# Patient Record
Sex: Male | Born: 1975 | Race: Black or African American | Hispanic: No | Marital: Married | Smoking: Current every day smoker
Health system: Southern US, Community
[De-identification: ages and names within clinical notes are randomized; demographics above are authoritative.]

## PROBLEM LIST (undated history)

## (undated) DIAGNOSIS — J45909 Unspecified asthma, uncomplicated: Secondary | ICD-10-CM

## (undated) HISTORY — PX: LEG SURGERY: SHX1003

---

## 2012-11-23 ENCOUNTER — Emergency Department (HOSPITAL_COMMUNITY)
Admission: EM | Admit: 2012-11-23 | Discharge: 2012-11-23 | Disposition: A | Discharge status: Home / Self Care | Payer: BC Managed Care – PPO | Attending: Emergency Medicine | Admitting: Emergency Medicine

## 2012-11-23 ENCOUNTER — Encounter (HOSPITAL_COMMUNITY): Payer: Self-pay | Admitting: Emergency Medicine

## 2012-11-23 DIAGNOSIS — K0889 Other specified disorders of teeth and supporting structures: Secondary | ICD-10-CM

## 2012-11-23 DIAGNOSIS — K089 Disorder of teeth and supporting structures, unspecified: Secondary | ICD-10-CM | POA: Insufficient documentation

## 2012-11-23 DIAGNOSIS — R51 Headache: Secondary | ICD-10-CM | POA: Insufficient documentation

## 2012-11-23 DIAGNOSIS — F17200 Nicotine dependence, unspecified, uncomplicated: Secondary | ICD-10-CM | POA: Insufficient documentation

## 2012-11-23 DIAGNOSIS — J45909 Unspecified asthma, uncomplicated: Secondary | ICD-10-CM | POA: Insufficient documentation

## 2012-11-23 HISTORY — DX: Unspecified asthma, uncomplicated: J45.909

## 2012-11-23 MED ORDER — HYDROCODONE-ACETAMINOPHEN 5-325 MG PO TABS
2.0000 | ORAL_TABLET | Freq: Once | ORAL | Status: AC
  Administered 2012-11-23: 2 via ORAL
  Filled 2012-11-23: qty 2

## 2012-11-23 MED ORDER — PENICILLIN V POTASSIUM 500 MG PO TABS: 500.0000 mg | ORAL_TABLET | Freq: Four times a day (QID) | ORAL | Status: AC

## 2012-11-23 MED ORDER — HYDROCODONE-ACETAMINOPHEN 5-325 MG PO TABS: 1.0000 | ORAL_TABLET | ORAL | Status: DC | PRN

## 2012-11-23 MED ORDER — IBUPROFEN 200 MG PO TABS
600.0000 mg | ORAL_TABLET | Freq: Once | ORAL | Status: AC
  Administered 2012-11-23: 600 mg via ORAL
  Filled 2012-11-23: qty 3

## 2012-11-23 NOTE — ED Provider Notes (Signed)
   History    CSN: [id number] Arrival date & time 11/23/12  5784  First MD Initiated Contact with Patient 11/23/12 0945     Chief Complaint  Patient presents with  . Dental Pain   (Consider location/radiation/quality/duration/timing/severity/associated sxs/prior Treatment) HPI Comments: 37 yo with smoking and dental issues in the past presents with right upper dental pain for a few days.  Pt has a dentist to fup with this week.  Tried motrin early this am, no improvement.   Patient is a 37 y.o. male presenting with tooth pain. The history is provided by the patient.  Dental Pain Location:  Upper Upper teeth location:  3/RU 1st molar Quality:  Aching Onset quality:  Sudden Timing:  Constant Associated symptoms: headaches   Associated symptoms: no fever and no neck pain    Past Medical History  Diagnosis Date  . Asthma    History reviewed. No pertinent past surgical history. No family history on file. History  Substance Use Topics  . Smoking status: Current Every Day Smoker  . Smokeless tobacco: Not on file  . Alcohol Use: Yes    Review of Systems  Constitutional: Negative for fever and chills.  HENT: Positive for dental problem. Negative for neck pain.   Eyes: Negative for visual disturbance.  Gastrointestinal: Negative for abdominal pain.  Neurological: Positive for headaches.    Allergies  Review of patient's allergies indicates not on file.  Home Medications   Current Outpatient Rx  Name  Route  Sig  Dispense  Refill  . HYDROcodone-acetaminophen (NORCO/VICODIN) 5-325 MG per tablet   Oral   Take 1-2 tablets by mouth every 4 (four) hours as needed for pain.   8 tablet   0   . penicillin v potassium (VEETID) 500 MG tablet   Oral   Take 1 tablet (500 mg total) by mouth 4 (four) times daily.   40 tablet   0    BP 130/88  Pulse 56  Temp(Src) 98.2 F (36.8 C) (Oral)  Resp 16  SpO2 99% Physical Exam  Nursing note and vitals reviewed. Constitutional:  He appears well-developed and well-nourished. No distress.  HENT:  Head: Normocephalic.  Poor dentition, multiple fractured teeth Tender gingiva/ right upper first molar, no abscess appreciated, facial swelling, submandibular swelling or trismus  Eyes: Conjunctivae are normal. Pupils are equal, round, and reactive to light.  Neck: Normal range of motion. Neck supple. No JVD present.  Cardiovascular: Normal rate.   Lymphadenopathy:    He has no cervical adenopathy.    ED Course  Procedures (including critical care time) Labs Reviewed - No data to display No results found. 1. Pain, dental     MDM  Well appearing. Dental fup discused abx and pain meds dc  Enid Skeens, MD 11/23/12 1009

## 2012-11-23 NOTE — ED Notes (Signed)
Pt. Stated, i started having dental pain with jaw and rt. Side ear pain.

## 2012-12-17 ENCOUNTER — Encounter (HOSPITAL_COMMUNITY): Payer: Self-pay | Admitting: *Deleted

## 2012-12-17 ENCOUNTER — Emergency Department (HOSPITAL_COMMUNITY)
Admission: EM | Admit: 2012-12-17 | Discharge: 2012-12-17 | Disposition: A | Discharge status: Home Health | Payer: BC Managed Care – PPO | Attending: Emergency Medicine | Admitting: Emergency Medicine

## 2012-12-17 DIAGNOSIS — K089 Disorder of teeth and supporting structures, unspecified: Secondary | ICD-10-CM | POA: Insufficient documentation

## 2012-12-17 DIAGNOSIS — K0889 Other specified disorders of teeth and supporting structures: Secondary | ICD-10-CM

## 2012-12-17 DIAGNOSIS — J45909 Unspecified asthma, uncomplicated: Secondary | ICD-10-CM | POA: Insufficient documentation

## 2012-12-17 DIAGNOSIS — F17200 Nicotine dependence, unspecified, uncomplicated: Secondary | ICD-10-CM | POA: Insufficient documentation

## 2012-12-17 MED ORDER — AMOXICILLIN 500 MG PO CAPS: 500.0000 mg | ORAL_CAPSULE | Freq: Three times a day (TID) | ORAL | Status: DC

## 2012-12-17 MED ORDER — TRAMADOL HCL 50 MG PO TABS: 50.0000 mg | ORAL_TABLET | Freq: Four times a day (QID) | ORAL | Status: DC | PRN

## 2012-12-17 NOTE — ED Provider Notes (Signed)
Medical screening examination/treatment/procedure(s) were performed by non-physician practitioner and as supervising physician I was immediately available for consultation/collaboration.  Ratliff Skene, M.D.     Robinson Skene, MD 12/17/12 [phone]

## 2012-12-17 NOTE — ED Notes (Signed)
Pt complains of dental pain in upper Rt molar x2 weeks. Pt denies having a dentist.

## 2012-12-17 NOTE — ED Provider Notes (Signed)
  CSN: [id number]     Arrival date & time 12/17/12  0033 History     First MD Initiated Contact with Patient 12/17/12 0048     Chief Complaint  Patient presents with  . Dental Pain   (Consider location/radiation/quality/duration/timing/severity/associated sxs/prior Treatment) HPI  37 year old male presents complaining of dental pain. Symptom has been ongoing for the past 2-3 weeks. Pain is described as a sharp and throbbing sensations which started at his right upper tooth and now radiates around his entire mouth. Pain is worsening with cold air or with chewing. Pain is the same pain that he had when he was seen to 3 weeks ago for the same complaint and was prescribed penicillin and Vicodin. States initially the medication has helped a few days but now the pain has returned. He has been taking ibuprofen with some relief but now ibuprofen is not helping. He has not had a chance to followup with a dentist. However, he requests for dental referral. Patient denies fever, headache, ear pain, throat swelling, neck pain, rash, or recent trauma. He is a smoker. Patient states in the past amoxicillin seems to help with his prior dental infection.  Past Medical History  Diagnosis Date  . Asthma    History reviewed. No pertinent past surgical history. History reviewed. No pertinent family history. History  Substance Use Topics  . Smoking status: Current Every Day Smoker -- 0.50 packs/day    Types: Cigarettes  . Smokeless tobacco: Not on file  . Alcohol Use: Yes     Comment: occasional    Review of Systems  Constitutional: Negative for fever.  HENT: Positive for dental problem. Negative for ear pain.   Skin: Negative for rash.  Neurological: Negative for headaches.    Allergies  Dilantin  Home Medications   Current Outpatient Rx  Name  Route  Sig  Dispense  Refill  . ibuprofen (ADVIL,MOTRIN) 200 MG tablet   Oral   Take 600 mg by mouth every 6 (six) hours as needed for pain.          BP 140/92  Pulse 64  Temp(Src) 98.5 F (36.9 C) (Oral)  Resp 18  SpO2 98% Physical Exam  Nursing note and vitals reviewed. Constitutional: He appears well-developed and well-nourished. No distress.  HENT:  Head: Atraumatic.  Right Ear: External ear normal.  Left Ear: External ear normal.  Mouth/Throat: Oropharynx is clear and moist.    Eyes: Conjunctivae are normal.  Neck: Neck supple.  Lymphadenopathy:    He has no cervical adenopathy.  Neurological: He is alert.  Skin: No rash noted.    ED Course   Procedures (including critical care time)  Patient with dental pain. No obvious abscess. No trismus. He request for a stronger medication than ibuprofen.  Will prescribe Ultram, and amox.  Dental referral given.  Pt agrees with plan.    Labs Reviewed - No data to display No results found. 1. Pain, dental     MDM  BP 140/92  Pulse 64  Temp(Src) 98.5 F (36.9 C) (Oral)  Resp 18  SpO2 98%   Fayrene Helper, PA-C 12/17/12 0140

## 2013-07-25 ENCOUNTER — Emergency Department (HOSPITAL_COMMUNITY): Payer: No Typology Code available for payment source

## 2013-07-25 ENCOUNTER — Emergency Department (HOSPITAL_COMMUNITY)
Admission: EM | Admit: 2013-07-25 | Discharge: 2013-07-25 | Disposition: A | Discharge status: Home / Self Care | Payer: No Typology Code available for payment source | Attending: Emergency Medicine | Admitting: Emergency Medicine

## 2013-07-25 ENCOUNTER — Encounter (HOSPITAL_COMMUNITY): Payer: Self-pay | Admitting: Emergency Medicine

## 2013-07-25 DIAGNOSIS — S46909A Unspecified injury of unspecified muscle, fascia and tendon at shoulder and upper arm level, unspecified arm, initial encounter: Secondary | ICD-10-CM | POA: Insufficient documentation

## 2013-07-25 DIAGNOSIS — Z79899 Other long term (current) drug therapy: Secondary | ICD-10-CM | POA: Insufficient documentation

## 2013-07-25 DIAGNOSIS — S199XXA Unspecified injury of neck, initial encounter: Secondary | ICD-10-CM

## 2013-07-25 DIAGNOSIS — S0993XA Unspecified injury of face, initial encounter: Secondary | ICD-10-CM | POA: Insufficient documentation

## 2013-07-25 DIAGNOSIS — F17200 Nicotine dependence, unspecified, uncomplicated: Secondary | ICD-10-CM | POA: Insufficient documentation

## 2013-07-25 DIAGNOSIS — S4980XA Other specified injuries of shoulder and upper arm, unspecified arm, initial encounter: Secondary | ICD-10-CM | POA: Insufficient documentation

## 2013-07-25 DIAGNOSIS — S99919A Unspecified injury of unspecified ankle, initial encounter: Secondary | ICD-10-CM

## 2013-07-25 DIAGNOSIS — S8990XA Unspecified injury of unspecified lower leg, initial encounter: Secondary | ICD-10-CM | POA: Insufficient documentation

## 2013-07-25 DIAGNOSIS — Y9389 Activity, other specified: Secondary | ICD-10-CM | POA: Insufficient documentation

## 2013-07-25 DIAGNOSIS — J45909 Unspecified asthma, uncomplicated: Secondary | ICD-10-CM | POA: Insufficient documentation

## 2013-07-25 DIAGNOSIS — S99929A Unspecified injury of unspecified foot, initial encounter: Secondary | ICD-10-CM

## 2013-07-25 DIAGNOSIS — Y92410 Unspecified street and highway as the place of occurrence of the external cause: Secondary | ICD-10-CM | POA: Insufficient documentation

## 2013-07-25 DIAGNOSIS — M549 Dorsalgia, unspecified: Secondary | ICD-10-CM

## 2013-07-25 DIAGNOSIS — IMO0002 Reserved for concepts with insufficient information to code with codable children: Secondary | ICD-10-CM | POA: Insufficient documentation

## 2013-07-25 MED ORDER — NAPROXEN 375 MG PO TABS: 375.0000 mg | ORAL_TABLET | Freq: Two times a day (BID) | ORAL | Status: DC

## 2013-07-25 MED ORDER — CYCLOBENZAPRINE HCL 10 MG PO TABS: 10.0000 mg | ORAL_TABLET | Freq: Two times a day (BID) | ORAL | Status: AC | PRN
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
## 2013-07-25 NOTE — ED Provider Notes (Signed)
Medical screening examination/treatment/procedure(s) were performed by non-physician practitioner and as supervising physician I was immediately available for consultation/collaboration.   EKG Interpretation None       Glynn OctaveStephen Allex Madia, MD 07/25/13 [phone]
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
## 2015-06-21 ENCOUNTER — Emergency Department (HOSPITAL_COMMUNITY)
Admission: EM | Admit: 2015-06-21 | Discharge: 2015-06-21 | Disposition: A | Discharge status: Home / Self Care | Payer: No Typology Code available for payment source | Attending: Emergency Medicine | Admitting: Emergency Medicine

## 2015-06-21 ENCOUNTER — Encounter (HOSPITAL_COMMUNITY): Payer: Self-pay

## 2015-06-21 ENCOUNTER — Emergency Department (HOSPITAL_COMMUNITY): Payer: No Typology Code available for payment source

## 2015-06-21 DIAGNOSIS — Z79899 Other long term (current) drug therapy: Secondary | ICD-10-CM | POA: Insufficient documentation

## 2015-06-21 DIAGNOSIS — J4521 Mild intermittent asthma with (acute) exacerbation: Secondary | ICD-10-CM | POA: Insufficient documentation

## 2015-06-21 DIAGNOSIS — F17210 Nicotine dependence, cigarettes, uncomplicated: Secondary | ICD-10-CM | POA: Insufficient documentation

## 2015-06-21 DIAGNOSIS — Z791 Long term (current) use of non-steroidal anti-inflammatories (NSAID): Secondary | ICD-10-CM | POA: Insufficient documentation

## 2015-06-21 MED ORDER — DEXAMETHASONE 4 MG PO TABS
10.0000 mg | ORAL_TABLET | Freq: Once | ORAL | Status: AC
  Administered 2015-06-21: 10 mg via ORAL
  Filled 2015-06-21: qty 2

## 2015-06-21 MED ORDER — ALBUTEROL SULFATE (2.5 MG/3ML) 0.083% IN NEBU
5.0000 mg | INHALATION_SOLUTION | Freq: Once | RESPIRATORY_TRACT | Status: AC
  Administered 2015-06-21: 5 mg via RESPIRATORY_TRACT
  Filled 2015-06-21: qty 6

## 2015-06-21 MED ORDER — ALBUTEROL SULFATE HFA 108 (90 BASE) MCG/ACT IN AERS
2.0000 | INHALATION_SPRAY | RESPIRATORY_TRACT | Status: DC | PRN
  Administered 2015-06-21: 2 via RESPIRATORY_TRACT
  Filled 2015-06-21: qty 6.7

## 2015-06-21 NOTE — ED Provider Notes (Signed)
CSN: [id number]     Arrival date & time 06/21/15  1191 History   First MD Initiated Contact with Patient 06/21/15 [phone]     Chief Complaint  Patient presents with  . Chest Pain  . Shortness of Breath     (Consider location/radiation/quality/duration/timing/severity/associated sxs/prior Treatment) HPI Comments: 40 year old male with a history of asthma who presents with shortness of breath and chest pain. Patient states that he normally uses an inhaler a few times a day as needed for asthma. He woke up with shortness of breath at 5:30 AM and could not find his inhaler. He states that his chest pain and shortness of breath or exactly like previous episodes of asthma. His symptoms have resolved since receiving a breathing treatment here. He denies any fevers, cough/cold symptoms, vomiting, diarrhea, abdominal pain, or recent illness.  Patient is a 40 y.o. male presenting with chest pain and shortness of breath. The history is provided by the patient.  Chest Pain Associated symptoms: shortness of breath   Shortness of Breath Associated symptoms: chest pain     Past Medical History  Diagnosis Date  . Asthma    Past Surgical History  Procedure Laterality Date  . Leg surgery      hardware placement.   History reviewed. No pertinent family history. Social History  Substance Use Topics  . Smoking status: Current Every Day Smoker -- 0.25 packs/day    Types: Cigarettes  . Smokeless tobacco: None  . Alcohol Use: Yes     Comment: occasional    Review of Systems  Respiratory: Positive for shortness of breath.   Cardiovascular: Positive for chest pain.   10 Systems reviewed and are negative for acute change except as noted in the HPI.    Allergies  Dilantin and Shellfish allergy  Home Medications   Prior to Admission medications   Medication Sig Start Date End Date Taking? Authorizing Provider  albuterol (PROVENTIL) (2.5 MG/3ML) 0.083% nebulizer solution Take 2.5 mg by  nebulization every 6 (six) hours as needed for wheezing or shortness of breath.    Historical Provider, MD  cyclobenzaprine (FLEXERIL) 10 MG tablet Take 1 tablet (10 mg total) by mouth 2 (two) times daily as needed for muscle spasms. 07/25/13   Tiffany Neva Seat, PA-C  ibuprofen (ADVIL,MOTRIN) 200 MG tablet Take 600 mg by mouth every 6 (six) hours as needed for pain.    Historical Provider, MD  naproxen (NAPROSYN) 375 MG tablet Take 1 tablet (375 mg total) by mouth 2 (two) times daily. 07/25/13   Tiffany Neva Seat, PA-C   BP 121/90 mmHg  Pulse 93  Temp(Src) 97.5 F (36.4 C) (Oral)  Resp 30  SpO2 100% Physical Exam  Constitutional: He is oriented to person, place, and time. He appears well-developed and well-nourished. No distress.  Walking around room  HENT:  Head: Normocephalic and atraumatic.  Moist mucous membranes  Eyes: Conjunctivae are normal. Pupils are equal, round, and reactive to light.  Neck: Neck supple.  Cardiovascular: Normal rate, regular rhythm and normal heart sounds.   No murmur heard. Pulmonary/Chest: Effort normal.  Scattered Occasional faint expiratory wheezes   Abdominal: Soft. Bowel sounds are normal. He exhibits no distension. There is no tenderness.  Musculoskeletal: He exhibits no edema.  Neurological: He is alert and oriented to person, place, and time.  Fluent speech  Skin: Skin is warm and dry.  Psychiatric: He has a normal mood and affect. Judgment normal.  Nursing note and vitals reviewed.   ED Course  Procedures (  including critical care time)   EKG Interpretation   Date/Time:  Monday June 21 2015 06:15:03 EST Ventricular Rate:  92 PR Interval:  207 QRS Duration: 92 QT Interval:  346 QTC Calculation: 428 R Axis:   58 Text Interpretation:  Sinus rhythm Borderline prolonged PR interval  Prominent P waves, nondiagnostic No previous ECGs available Confirmed by  Sallyanne Birkhead MD, Kadedra Vanaken ([id number]) on 06/21/2015 7:25:18 AM      MDM   Final diagnoses:   Asthma exacerbation attacks, mild intermittent    Pt p/w SOB and chest pain c/w previous asthma episodes, couldn't find inhaler this morning. He was well-appearing on exam with normal vital signs at presentation. During my examination, he had already received breathing treatment and stated that her symptoms had resolved. Normal work of breathing, occasional faint expiratory wheezes. O2 sat 100% on room air. EKG was unremarkable. The patient refused chest x-ray or other workup as he stated that he only wanted albuterol and felt his symptoms were exactly like previous asthma exacerbations. Gave the patient Decadron and albuterol w/ spacer, instructed on use. Discussed return precautions and patient voiced understanding. Reviewed follow up plan and patient was discharged in satisfactory condition.   Laurence Spates, MD 06/21/15 [phone]

## 2015-06-21 NOTE — ED Notes (Signed)
Pt. Is declining x-ray

## 2015-06-21 NOTE — Discharge Instructions (Signed)
Asthma, Adult Asthma is a recurring condition in which the airways tighten and narrow. Asthma can make it difficult to breathe. It can cause coughing, wheezing, and shortness of breath. Asthma episodes, also called asthma attacks, range from minor to life-threatening. Asthma cannot be cured, but medicines and lifestyle changes can help control it. CAUSES Asthma is believed to be caused by inherited (genetic) and environmental factors, but its exact cause is unknown. Asthma may be triggered by allergens, lung infections, or irritants in the air. Asthma triggers are different for each person. Common triggers include:   Animal dander.  Dust mites.  Cockroaches.  Pollen from trees or grass.  Mold.  Smoke.  Air pollutants such as dust, household cleaners, hair sprays, aerosol sprays, paint fumes, strong chemicals, or strong odors.  Cold air, weather changes, and winds (which increase molds and pollens in the air).  Strong emotional expressions such as crying or laughing hard.  Stress.  Certain medicines (such as aspirin) or types of drugs (such as beta-blockers).  Sulfites in foods and drinks. Foods and drinks that may contain sulfites include dried fruit, potato chips, and sparkling grape juice.  Infections or inflammatory conditions such as the flu, a cold, or an inflammation of the nasal membranes (rhinitis).  Gastroesophageal reflux disease (GERD).  Exercise or strenuous activity. SYMPTOMS Symptoms may occur immediately after asthma is triggered or many hours later. Symptoms include:  Wheezing.  Excessive nighttime or early morning coughing.  Frequent or severe coughing with a common cold.  Chest tightness.  Shortness of breath. DIAGNOSIS  The diagnosis of asthma is made by a review of your medical history and a physical exam. Tests may also be performed. These may include:  Lung function studies. These tests show how much air you breathe in and out.  Allergy  tests.  Imaging tests such as X-rays. TREATMENT  Asthma cannot be cured, but it can usually be controlled. Treatment involves identifying and avoiding your asthma triggers. It also involves medicines. There are 2 classes of medicine used for asthma treatment:   Controller medicines. These prevent asthma symptoms from occurring. They are usually taken every day.  Reliever or rescue medicines. These quickly relieve asthma symptoms. They are used as needed and provide short-term relief. Your health care provider will help you create an asthma action plan. An asthma action plan is a written plan for managing and treating your asthma attacks. It includes a list of your asthma triggers and how they may be avoided. It also includes information on when medicines should be taken and when their dosage should be changed. An action plan may also involve the use of a device called a peak flow meter. A peak flow meter measures how well the lungs are working. It helps you monitor your condition. HOME CARE INSTRUCTIONS   Take medicines only as directed by your health care provider. Speak with your health care provider if you have questions about how or when to take the medicines.  Use a peak flow meter as directed by your health care provider. Record and keep track of readings.  Understand and use the action plan to help minimize or stop an asthma attack without needing to seek medical care.  Control your home environment in the following ways to help prevent asthma attacks:  Do not smoke. Avoid being exposed to secondhand smoke.  Change your heating and air conditioning filter regularly.  Limit your use of fireplaces and wood stoves.  Get rid of pests (such as roaches   and mice) and their droppings.  Throw away plants if you see mold on them.  Clean your floors and dust regularly. Use unscented cleaning products.  Try to have someone else vacuum for you regularly. Stay out of rooms while they are  being vacuumed and for a short while afterward. If you vacuum, use a dust mask from a hardware store, a double-layered or microfilter vacuum cleaner bag, or a vacuum cleaner with a HEPA filter.  Replace carpet with wood, tile, or vinyl flooring. Carpet can trap dander and dust.  Use allergy-proof pillows, mattress covers, and box spring covers.  Wash bed sheets and blankets every week in hot water and dry them in a dryer.  Use blankets that are made of polyester or cotton.  Clean bathrooms and kitchens with bleach. If possible, have someone repaint the walls in these rooms with mold-resistant paint. Keep out of the rooms that are being cleaned and painted.  Wash hands frequently. SEEK MEDICAL CARE IF:   You have wheezing, shortness of breath, or a cough even if taking medicine to prevent attacks.  The colored mucus you cough up (sputum) is thicker than usual.  Your sputum changes from clear or white to yellow, green, gray, or bloody.  You have any problems that may be related to the medicines you are taking (such as a rash, itching, swelling, or trouble breathing).  You are using a reliever medicine more than 2-3 times per week.  Your peak flow is still at 50-79% of your personal best after following your action plan for 1 hour.  You have a fever. SEEK IMMEDIATE MEDICAL CARE IF:   You seem to be getting worse and are unresponsive to treatment during an asthma attack.  You are short of breath even at rest.  You get short of breath when doing very Dylan Ruiz physical activity.  You have difficulty eating, drinking, or talking due to asthma symptoms.  You develop chest pain.  You develop a fast heartbeat.  You have a bluish color to your lips or fingernails.  You are light-headed, dizzy, or faint.  Your peak flow is less than 50% of your personal best.   This information is not intended to replace advice given to you by your health care provider. Make sure you discuss any  questions you have with your health care provider.   Document Released: 04/24/2005 Document Revised: 01/13/2015 Document Reviewed: 11/21/2012 Elsevier Interactive Patient Education 2016 [employer]  

## 2015-06-21 NOTE — ED Notes (Addendum)
Pt. States that he no longer feels chest pain after receiving breathing treatment. He ran out of his breathing treatments and says he needs a prescription for them.

## 2015-06-21 NOTE — ED Provider Notes (Signed)
ED ECG REPORT   Date: 06/21/2015  Rate: 92  Rhythm: normal sinus rhythm  QRS Axis: normal  Intervals: PR prolonged  ST/T Wave abnormalities: normal  Conduction Disutrbances:none  Narrative Interpretation: artifact present  Old EKG Reviewed: none available  I have personally reviewed the EKG tracing and agree with the computerized printout as noted. Devoria Albe, MD, Concha Pyo, MD 06/21/15 [phone]

## 2015-06-21 NOTE — ED Notes (Signed)
Pt wondered into the surgical center complaining of being short of breath and having chest pain, pt states he woke up and couldn't catch his breath and couldn't find his inhaler this am

## 2015-10-07 ENCOUNTER — Emergency Department (HOSPITAL_COMMUNITY)
Admission: EM | Admit: 2015-10-07 | Discharge: 2015-10-07 | Disposition: A | Discharge status: Home / Self Care | Payer: Managed Care, Other (non HMO) | Attending: Emergency Medicine | Admitting: Emergency Medicine

## 2015-10-07 ENCOUNTER — Encounter (HOSPITAL_COMMUNITY): Payer: Self-pay | Admitting: Emergency Medicine

## 2015-10-07 DIAGNOSIS — J45909 Unspecified asthma, uncomplicated: Secondary | ICD-10-CM | POA: Insufficient documentation

## 2015-10-07 DIAGNOSIS — K0889 Other specified disorders of teeth and supporting structures: Secondary | ICD-10-CM | POA: Diagnosis not present

## 2015-10-07 DIAGNOSIS — F17210 Nicotine dependence, cigarettes, uncomplicated: Secondary | ICD-10-CM | POA: Diagnosis not present

## 2015-10-07 DIAGNOSIS — K029 Dental caries, unspecified: Secondary | ICD-10-CM | POA: Insufficient documentation

## 2015-10-07 MED ORDER — PENICILLIN V POTASSIUM 500 MG PO TABS: 500.0000 mg | ORAL_TABLET | Freq: Three times a day (TID) | ORAL | Status: AC

## 2015-10-07 MED ORDER — NAPROXEN 500 MG PO TABS: 500.0000 mg | ORAL_TABLET | Freq: Two times a day (BID) | ORAL | Status: AC

## 2015-10-07 NOTE — ED Notes (Signed)
Poor dentition, states he can't get into a dentist to have several teeth pulled for the next two week. C/o several teeth hurting. Face does not appear obviously swollen

## 2015-10-07 NOTE — ED Provider Notes (Signed)
CSN: [id number]     Arrival date & time 10/07/15  0944 History   First MD Initiated Contact with Patient 10/07/15 [phone]     Chief Complaint  Patient presents with  . Dental Pain     (Consider location/radiation/quality/duration/timing/severity/associated sxs/prior Treatment) HPI Comments: Patient presents with right maxillary dental pain for the past 3 days area patient states he has an appointment with his dentist on 10/21/2015. Patient has not had any facial swelling. He has been taking "pain medication" without relief. No trouble breathing or swallowing. No fever. Onset of symptoms acute. Course is constant. Nothing makes symptoms better.  Patient is a 40 y.o. male presenting with tooth pain. The history is provided by the patient.  Dental Pain Associated symptoms: no facial swelling, no fever, no headaches and no neck pain     Past Medical History  Diagnosis Date  . Asthma    Past Surgical History  Procedure Laterality Date  . Leg surgery      hardware placement.   History reviewed. No pertinent family history. Social History  Substance Use Topics  . Smoking status: Current Every Day Smoker -- 0.25 packs/day    Types: Cigarettes  . Smokeless tobacco: None  . Alcohol Use: Yes     Comment: occasional    Review of Systems  Constitutional: Negative for fever.  HENT: Positive for dental problem. Negative for ear pain, facial swelling, sore throat and trouble swallowing.   Respiratory: Negative for shortness of breath and stridor.   Musculoskeletal: Negative for neck pain.  Skin: Negative for color change.  Neurological: Negative for headaches.      Allergies  Dilantin and Shellfish allergy  Home Medications   Prior to Admission medications   Medication Sig Start Date End Date Taking? Authorizing Provider  albuterol (PROVENTIL) (2.5 MG/3ML) 0.083% nebulizer solution Take 2.5 mg by nebulization every 6 (six) hours as needed for wheezing or shortness of breath.     Historical Provider, MD  cyclobenzaprine (FLEXERIL) 10 MG tablet Take 1 tablet (10 mg total) by mouth 2 (two) times daily as needed for muscle spasms. 07/25/13   Tiffany Neva Seat, PA-C  ibuprofen (ADVIL,MOTRIN) 200 MG tablet Take 600 mg by mouth every 6 (six) hours as needed for pain.    Historical Provider, MD  naproxen (NAPROSYN) 375 MG tablet Take 1 tablet (375 mg total) by mouth 2 (two) times daily. 07/25/13   Tiffany Neva Seat, PA-C   BP 146/91 mmHg  Pulse 65  Temp(Src) 98.9 F (37.2 C) (Oral)  Resp 19  SpO2 100%   Physical Exam  Constitutional: He appears well-developed and well-nourished.  HENT:  Head: Normocephalic and atraumatic.  Right Ear: Tympanic membrane, external ear and ear canal normal.  Left Ear: Tympanic membrane, external ear and ear canal normal.  Nose: Nose normal.  Mouth/Throat: Uvula is midline, oropharynx is clear and moist and mucous membranes are normal. No trismus in the jaw. Abnormal dentition. Dental caries present. No dental abscesses or uvula swelling. No tonsillar abscesses.  Patient with several broken maxillary molars and erupting wisdom tooth. No swelling or erythema noted on exam.  Eyes: Pupils are equal, round, and reactive to light.  Neck: Normal range of motion. Neck supple.  No neck swelling or Lugwig's angina  Neurological: He is alert.  Skin: Skin is warm and dry.  Psychiatric: He has a normal mood and affect.  Nursing note and vitals reviewed.   ED Course  Procedures (including critical care time)  10:37 AM Patient seen  and examined.  Vital signs reviewed and are as follows: BP 146/91 mmHg  Pulse 65  Temp(Src) 98.9 F (37.2 C) (Oral)  Resp 19  SpO2 100%  Patient counseled to take prescribed medications as directed, return with worsening facial or neck swelling, and to follow-up with their dentist as soon as possible.    MDM   Final diagnoses:  Pain, dental   Patient with toothache. No fever. Exam unconcerning for Ludwig's angina  or other deep tissue infection in neck. Will treat with antibiotic and pain medicine. Urged patient to follow-up with dentist.      Renne CriglerJoshua Julen Rubert, PA-C 10/07/15 1040  Melene Planan Floyd, DO 10/07/15 1043

## 2015-10-07 NOTE — Discharge Instructions (Signed)
Please read and follow all provided instructions.  Your diagnoses today include:  1. Pain, dental     The exam and treatment you received today has been provided on an emergency basis only. This is not a substitute for complete medical or dental care.  Tests performed today include:  Vital signs. See below for your results today.   Medications prescribed:   Naproxen - anti-inflammatory pain medication  Do not exceed 500mg  naproxen every 12 hours, take with food  You have been prescribed an anti-inflammatory medication or NSAID. Take with food. Take smallest effective dose for the shortest duration needed for your pain. Stop taking if you experience stomach pain or vomiting.    Penicillin - antibiotic  You have been prescribed an antibiotic medicine: take the entire course of medicine even if you are feeling better. Stopping early can cause the antibiotic not to work.  Take any prescribed medications only as directed.  Home care instructions:  Follow any educational materials contained in this packet.  Follow-up instructions: Please follow-up with your dentist for further evaluation of your symptoms.   Dental Assistance: See below for dental referrals  Return instructions:   Please return to the Emergency Department if you experience worsening symptoms.  Please return if you develop a fever, you develop more swelling in your face or neck, you have trouble breathing or swallowing food.  Please return if you have any other emergent concerns.  Additional Information:  Your vital signs today were: BP 146/91 mmHg   Pulse 65   Temp(Src) 98.9 F (37.2 C) (Oral)   Resp 19   SpO2 100% If your blood pressure (BP) was elevated above 135/85 this visit, please have this repeated by your doctor within one month. --------------
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
## 2017-08-04 ENCOUNTER — Emergency Department (HOSPITAL_COMMUNITY)
Admission: EM | Admit: 2017-08-04 | Discharge: 2017-08-04 | Discharge status: Absent without leave | Payer: Managed Care, Other (non HMO)
